# Patient Record
Sex: Male | Born: 1935 | Race: White | Hispanic: No
Health system: Southern US, Community
[De-identification: ages and names within clinical notes are randomized; demographics above are authoritative.]

---

## 2008-10-27 ENCOUNTER — Ambulatory Visit: Payer: Self-pay

## 2009-12-10 ENCOUNTER — Ambulatory Visit: Payer: Self-pay

## 2010-06-09 ENCOUNTER — Ambulatory Visit: Payer: Self-pay

## 2010-08-11 ENCOUNTER — Ambulatory Visit: Payer: Self-pay

## 2011-10-13 ENCOUNTER — Ambulatory Visit: Payer: Self-pay | Admitting: Hospitalist

## 2011-10-13 LAB — PROTIME-INR: INR: 2.8

## 2013-10-25 ENCOUNTER — Ambulatory Visit: Payer: Self-pay

## 2013-10-25 LAB — PROTIME-INR
INR: 2
PROTHROMBIN TIME: 22.3 s — AB (ref 11.5–14.7)

## 2014-08-15 ENCOUNTER — Ambulatory Visit: Payer: Self-pay | Admitting: Internal Medicine

## 2014-08-16 ENCOUNTER — Ambulatory Visit: Payer: Self-pay | Admitting: Internal Medicine

## 2014-08-16 LAB — URINALYSIS, COMPLETE
BILIRUBIN, UR: NEGATIVE
Bacteria: NONE SEEN
Blood: NEGATIVE
Glucose,UR: NEGATIVE mg/dL (ref 0–75)
Hyaline Cast: 2
Ketone: NEGATIVE
LEUKOCYTE ESTERASE: NEGATIVE
Nitrite: NEGATIVE
PH: 5 (ref 4.5–8.0)
Protein: 100
RBC,UR: 3 /HPF (ref 0–5)
Specific Gravity: 1.02 (ref 1.003–1.030)
Squamous Epithelial: NONE SEEN
WBC UR: 1 /HPF (ref 0–5)

## 2014-08-16 LAB — CBC
HCT: 31.5 % — ABNORMAL LOW (ref 40.0–52.0)
HGB: 10.5 g/dL — AB (ref 13.0–18.0)
MCH: 32.7 pg (ref 26.0–34.0)
MCHC: 33.3 g/dL (ref 32.0–36.0)
MCV: 98 fL (ref 80–100)
PLATELETS: 257 10*3/uL (ref 150–440)
RBC: 3.21 10*6/uL — ABNORMAL LOW (ref 4.40–5.90)
RDW: 12.3 % (ref 11.5–14.5)
WBC: 10.1 10*3/uL (ref 3.8–10.6)

## 2014-08-16 LAB — COMPREHENSIVE METABOLIC PANEL
ALBUMIN: 2.6 g/dL — AB (ref 3.4–5.0)
ANION GAP: 4 — AB (ref 7–16)
AST: 58 U/L — AB (ref 15–37)
Alkaline Phosphatase: 81 U/L
BILIRUBIN TOTAL: 0.3 mg/dL (ref 0.2–1.0)
BUN: 37 mg/dL — AB (ref 7–18)
CREATININE: 0.98 mg/dL (ref 0.60–1.30)
Calcium, Total: 8.6 mg/dL (ref 8.5–10.1)
Chloride: 107 mmol/L (ref 98–107)
Co2: 34 mmol/L — ABNORMAL HIGH (ref 21–32)
EGFR (Non-African Amer.): >60
Glucose: 117 mg/dL — ABNORMAL HIGH (ref 65–99)
Osmolality: 298 (ref 275–301)
Potassium: 4.6 mmol/L (ref 3.5–5.1)
SGPT (ALT): 70 U/L — ABNORMAL HIGH
SODIUM: 145 mmol/L (ref 136–145)
Total Protein: 7 g/dL (ref 6.4–8.2)

## 2014-08-16 LAB — FERRITIN: Ferritin (ARMC): 379 ng/mL (ref 8–388)

## 2014-08-16 LAB — TROPONIN I: Troponin-I: 0.02 ng/mL

## 2014-08-16 LAB — CK-MB: CK-MB: 2 ng/mL (ref 0.5–3.6)

## 2014-08-16 LAB — CK TOTAL AND CKMB (NOT AT ARMC)
CK, Total: 198 U/L
CK-MB: 2.5 ng/mL

## 2014-08-16 LAB — IRON AND TIBC
Iron Bind.Cap.(Total): 167 ug/dL — ABNORMAL LOW
Iron Saturation: 23 %
Iron: 39 ug/dL — ABNORMAL LOW
Unbound Iron-Bind.Cap.: 128 ug/dL

## 2014-08-16 LAB — CK: CK, Total: 173 U/L (ref 39–308)

## 2014-08-16 LAB — PROTIME-INR
INR: 1.1
Prothrombin Time: 14.1 secs (ref 11.5–14.7)

## 2014-08-16 LAB — PRO B NATRIURETIC PEPTIDE: B-Type Natriuretic Peptide: 8551 pg/mL — ABNORMAL HIGH (ref 0–450)

## 2014-08-16 LAB — APTT: Activated PTT: 31.3 s

## 2014-08-17 LAB — CBC WITH DIFFERENTIAL/PLATELET
Basophil #: 0 10*3/uL (ref 0.0–0.1)
Basophil %: 0.3 %
EOS PCT: 0.2 %
Eosinophil #: 0 10*3/uL (ref 0.0–0.7)
HCT: 30.1 % — AB (ref 40.0–52.0)
HGB: 9.9 g/dL — ABNORMAL LOW (ref 13.0–18.0)
LYMPHS PCT: 10 %
Lymphocyte #: 1.1 10*3/uL (ref 1.0–3.6)
MCH: 31.9 pg (ref 26.0–34.0)
MCHC: 33 g/dL (ref 32.0–36.0)
MCV: 97 fL (ref 80–100)
Monocyte #: 1.4 x10 3/mm — ABNORMAL HIGH (ref 0.2–1.0)
Monocyte %: 12.2 %
NEUTROS PCT: 77.3 %
Neutrophil #: 8.7 10*3/uL — ABNORMAL HIGH (ref 1.4–6.5)
Platelet: 253 10*3/uL (ref 150–440)
RBC: 3.12 10*6/uL — AB (ref 4.40–5.90)
RDW: 12.3 % (ref 11.5–14.5)
WBC: 11.2 10*3/uL — AB (ref 3.8–10.6)

## 2014-08-17 LAB — TROPONIN I: Troponin-I: <0.02 ng/mL

## 2014-08-17 LAB — BASIC METABOLIC PANEL
Anion Gap: 7 (ref 7–16)
BUN: 29 mg/dL — ABNORMAL HIGH (ref 7–18)
CREATININE: 0.99 mg/dL (ref 0.60–1.30)
Calcium, Total: 8.3 mg/dL — ABNORMAL LOW (ref 8.5–10.1)
Chloride: 103 mmol/L (ref 98–107)
Co2: 34 mmol/L — ABNORMAL HIGH (ref 21–32)
EGFR (African American): >60
EGFR (Non-African Amer.): >60
Glucose: 97 mg/dL (ref 65–99)
Osmolality: 293 (ref 275–301)
POTASSIUM: 3.6 mmol/L (ref 3.5–5.1)
Sodium: 144 mmol/L (ref 136–145)

## 2014-08-17 LAB — CK
CK, TOTAL: 155 U/L (ref 39–308)
CK, Total: 158 U/L (ref 39–308)

## 2014-08-17 LAB — CK-MB: CK-MB: 2 ng/mL (ref 0.5–3.6)

## 2014-08-18 ENCOUNTER — Inpatient Hospital Stay: Payer: Self-pay | Admitting: Internal Medicine

## 2014-08-18 LAB — BASIC METABOLIC PANEL
Anion Gap: 6 — ABNORMAL LOW (ref 7–16)
BUN: 27 mg/dL — AB (ref 7–18)
CHLORIDE: 98 mmol/L (ref 98–107)
CREATININE: 0.92 mg/dL (ref 0.60–1.30)
Calcium, Total: 8.7 mg/dL (ref 8.5–10.1)
Co2: 37 mmol/L — ABNORMAL HIGH (ref 21–32)
GLUCOSE: 95 mg/dL (ref 65–99)
Osmolality: 286 (ref 275–301)
POTASSIUM: 3.6 mmol/L (ref 3.5–5.1)
SODIUM: 141 mmol/L (ref 136–145)

## 2014-08-19 LAB — BASIC METABOLIC PANEL
Anion Gap: 3 — ABNORMAL LOW (ref 7–16)
BUN: 30 mg/dL — ABNORMAL HIGH (ref 7–18)
CALCIUM: 8.4 mg/dL — AB (ref 8.5–10.1)
CHLORIDE: 95 mmol/L — AB (ref 98–107)
Co2: 40 mmol/L (ref 21–32)
Creatinine: 1.24 mg/dL (ref 0.60–1.30)
EGFR (African American): >60
GFR CALC NON AF AMER: 58 — AB
GLUCOSE: 103 mg/dL — AB (ref 65–99)
OSMOLALITY: 282 (ref 275–301)
Potassium: 3.3 mmol/L — ABNORMAL LOW (ref 3.5–5.1)
Sodium: 138 mmol/L (ref 136–145)

## 2014-08-19 LAB — CBC WITH DIFFERENTIAL/PLATELET
COMMENT - H1-COM1: NORMAL
EOS PCT: 3 %
HCT: 33.2 % — ABNORMAL LOW (ref 40.0–52.0)
HGB: 11.3 g/dL — AB (ref 13.0–18.0)
Lymphocytes: 13 %
MCH: 32.9 pg (ref 26.0–34.0)
MCHC: 34.2 g/dL (ref 32.0–36.0)
MCV: 96 fL (ref 80–100)
METAMYELOCYTE: 2 %
Monocytes: 10 %
Myelocyte: 1 %
Platelet: 322 10*3/uL (ref 150–440)
RBC: 3.44 10*6/uL — ABNORMAL LOW (ref 4.40–5.90)
RDW: 12.1 % (ref 11.5–14.5)
Segmented Neutrophils: 71 %
WBC: 12.6 10*3/uL — ABNORMAL HIGH (ref 3.8–10.6)

## 2014-08-19 LAB — CLOSTRIDIUM DIFFICILE(ARMC)

## 2014-08-20 LAB — BASIC METABOLIC PANEL
Anion Gap: 2 — ABNORMAL LOW (ref 7–16)
BUN: 43 mg/dL — AB (ref 7–18)
CALCIUM: 8.4 mg/dL — AB (ref 8.5–10.1)
CHLORIDE: 97 mmol/L — AB (ref 98–107)
CO2: 38 mmol/L — AB (ref 21–32)
Creatinine: 1.24 mg/dL (ref 0.60–1.30)
EGFR (African American): >60
EGFR (Non-African Amer.): 58 — ABNORMAL LOW
Glucose: 110 mg/dL — ABNORMAL HIGH (ref 65–99)
OSMOLALITY: 285 (ref 275–301)
POTASSIUM: 3.9 mmol/L (ref 3.5–5.1)
Sodium: 137 mmol/L (ref 136–145)

## 2014-08-21 LAB — CULTURE, BLOOD (SINGLE)

## 2014-08-23 LAB — BASIC METABOLIC PANEL
Anion Gap: 6 — ABNORMAL LOW (ref 7–16)
BUN: 44 mg/dL — ABNORMAL HIGH (ref 7–18)
CALCIUM: 8.7 mg/dL (ref 8.5–10.1)
Chloride: 103 mmol/L (ref 98–107)
Co2: 32 mmol/L (ref 21–32)
Creatinine: 1.08 mg/dL (ref 0.60–1.30)
GLUCOSE: 120 mg/dL — AB (ref 65–99)
Osmolality: 294 (ref 275–301)
POTASSIUM: 4.9 mmol/L (ref 3.5–5.1)
SODIUM: 141 mmol/L (ref 136–145)

## 2014-08-24 LAB — CBC WITH DIFFERENTIAL/PLATELET
Basophil #: 0 10*3/uL (ref 0.0–0.1)
Basophil %: 0.2 %
EOS ABS: 0 10*3/uL (ref 0.0–0.7)
EOS PCT: 0.1 %
HCT: 31.7 % — ABNORMAL LOW (ref 40.0–52.0)
HGB: 10.9 g/dL — ABNORMAL LOW (ref 13.0–18.0)
Lymphocyte #: 1.5 10*3/uL (ref 1.0–3.6)
Lymphocyte %: 11.7 %
MCH: 36.5 pg — AB (ref 26.0–34.0)
MCHC: 34.2 g/dL (ref 32.0–36.0)
MCV: 107 fL — ABNORMAL HIGH (ref 80–100)
MONO ABS: 1.2 x10 3/mm — AB (ref 0.2–1.0)
Monocyte %: 9 %
NEUTROS PCT: 79 %
Neutrophil #: 10.1 10*3/uL — ABNORMAL HIGH (ref 1.4–6.5)
PLATELETS: 359 10*3/uL (ref 150–440)
RBC: 2.98 10*6/uL — AB (ref 4.40–5.90)
RDW: 12.5 % (ref 11.5–14.5)
WBC: 12.7 10*3/uL — ABNORMAL HIGH (ref 3.8–10.6)

## 2014-08-24 LAB — COMPREHENSIVE METABOLIC PANEL
ALBUMIN: 2.2 g/dL — AB (ref 3.4–5.0)
ANION GAP: 3 — AB (ref 7–16)
AST: 23 U/L (ref 15–37)
Alkaline Phosphatase: 61 U/L
BUN: 41 mg/dL — ABNORMAL HIGH (ref 7–18)
Bilirubin,Total: 0.3 mg/dL (ref 0.2–1.0)
CO2: 34 mmol/L — AB (ref 21–32)
Calcium, Total: 8.5 mg/dL (ref 8.5–10.1)
Chloride: 102 mmol/L (ref 98–107)
Creatinine: 1.05 mg/dL (ref 0.60–1.30)
EGFR (African American): >60
GLUCOSE: 97 mg/dL (ref 65–99)
Osmolality: 288 (ref 275–301)
Potassium: 4.5 mmol/L (ref 3.5–5.1)
SGPT (ALT): 33 U/L
Sodium: 139 mmol/L (ref 136–145)
Total Protein: 6 g/dL — ABNORMAL LOW (ref 6.4–8.2)

## 2014-08-25 LAB — WBC: WBC: 14 10*3/uL — AB (ref 3.8–10.6)

## 2014-08-26 LAB — BASIC METABOLIC PANEL
Anion Gap: 1 — ABNORMAL LOW (ref 7–16)
BUN: 46 mg/dL — ABNORMAL HIGH (ref 7–18)
CALCIUM: 8.3 mg/dL — AB (ref 8.5–10.1)
CHLORIDE: 102 mmol/L (ref 98–107)
CO2: 36 mmol/L — AB (ref 21–32)
Creatinine: 1.05 mg/dL (ref 0.60–1.30)
EGFR (African American): >60
EGFR (Non-African Amer.): >60
Glucose: 92 mg/dL (ref 65–99)
OSMOLALITY: 289 (ref 275–301)
POTASSIUM: 4.7 mmol/L (ref 3.5–5.1)
Sodium: 139 mmol/L (ref 136–145)

## 2014-08-26 LAB — WBC: WBC: 15.1 10*3/uL — ABNORMAL HIGH (ref 3.8–10.6)

## 2014-09-15 ENCOUNTER — Ambulatory Visit: Payer: Self-pay | Admitting: Internal Medicine

## 2014-12-10 NOTE — H&P (Signed)
PATIENT NAME:  Ralph Carrillo, Ralph Carrillo MR#:  [id number] DATE OF BIRTH:  01/20/1919  DATE OF ADMISSION:  08/16/2014  REFERRING EMERGENCY ROOM PHYSICIAN: Cecille Amsterdam. Mayford Knife, MD  PRIMARY CARE PHYSICIAN: Nonlocal. The patient is from [place], [place].   HISTORY OF PRESENT ILLNESS: The history of present illness is provided by his daughter, as the patient is feeling ill. The daughter reports that on December 24, patient started to feel poorly, with shortness of breath and coughing. She reports that he has been unable to sleep in his bed with fewer than 2 pillows and has usually been sleeping in a chair. He has been more confused than normal. He has had a temperature up to 99 degrees at home. No chest pain. His appetite has been decreased. No vomiting, diarrhea, or complaints of abdominal pain. He went to the [facility] today and was found to be acutely dyspneic with good oxygen saturation and was referred to the Emergency Room. On presentation to the Emergency Room, his oxygen saturations were in the high 80s which improved on nasal cannula.   PAST MEDICAL HISTORY: 1. Osteoarthritis in the right knee.  2. Mild dementia.  3. The patient has had a defibrillator in place since the 1980s. His daughter is not sure what diagnosis caused him to have the defibrillator placed. She states that this is not a pacemaker.  4. Daughter reports occasional need for supplemental oxygen. She does not know what diagnosis causes this to occur. He does not have a known diagnosis of COPD or congestive heart failure.   SOCIAL HISTORY: The patient lives in [place]. His primary care and cardiologist are both in [place]. He quit smoking in the 1960s. Does not drink alcohol. He uses a walker for ambulation.   FAMILY MEDICAL HISTORY: Positive for congenital arrhythmia, which has caused many of his young family members to have defibrillators placed. No history of stroke. No history of coronary artery disease known to the daughter.    ALLERGIES: THE PATIENT IS ALLERGIC TO STRAWBERRIES AND TO LIPITOR.   HOME MEDICATIONS: 1. Zyflamend tablets 1 tablet twice a day.  2. Vitamin D3 at 5000 international units 2 tablets once a day.  3. Vitamin B12 at 1 tablet once a day.  4. Pro-Omega tablet 1 tablet twice a day.  5. Levothyroxine 50 mcg 1 tablet once a day.  6. Glucosamine 750 mg 1 tablet twice a day.  7. Choleast 1 tablet once a day in the evening.  8. Aspirin 81 mg 1 tablet once a day.  9. Advil 200 mg 1 tablet every 6 hours as needed for pain.   REVIEW OF SYSTEMS: The patient is unable to perform the review of systems due to dementia and fatigue.   PHYSICAL EXAMINATION: VITAL SIGNS: Temperature 98.4, pulse 71, respirations 22, blood pressure 160/95, oxygenation 99% on 2 liters nasal cannula.  GENERAL: The patient appears fatigued, in no acute distress.  HEENT: Pupils equal, round, and reactive to light. Conjunctivae are clear. Extraocular motion intact. Oral mucous membranes are dry. Good dentition. Posterior oropharynx is clear with no exudate, edema, or erythema.  NECK: Supple, trachea midline. No cervical lymphadenopathy.  RESPIRATORY: There are bibasilar crackles. No wheezes or rhonchi. Good air movement.  CARDIOVASCULAR: Tachycardic, regular. No murmurs, rubs, or gallops. No peripheral edema. Peripheral pulses are 2+.  ABDOMEN: Soft, nontender, nondistended. Bowel sounds are normal. No guarding, no rebound, no hepatosplenomegaly, no mass.  MUSCULOSKELETAL: No joint effusions. Range of motion is normal. Strength is decreased  generally but is symmetric.  NEUROLOGIC: Cranial nerves II through XII grossly intact. Strength is generally decreased due to fatigue and illness. No focal defects. Sensation is intact.  PSYCHIATRIC: The patient is lethargic but he does open his eyes and answer appropriately. He is oriented to person, place, and situation. No signs of uncontrolled depression or anxiety.   LABORATORY DATA:  Sodium 145, potassium 4.6, chloride 107, bicarbonate 34, BUN 37, creatinine 0.98, glucose 117. BNP is 8551. LFTs are normal with the exception of a decreased albumin at 2.6. AST is 58, ALT is 70, both elevated. Initial set of cardiac enzymes was negative with a troponin of less than 0.02. White blood cells 10.1, hemoglobin 10.5, platelets 257,000, MCV 98. INR is 1.1. Urinalysis negative for signs of infection.   IMAGING:  1. CT of the head without contrast shows no acute intracranial abnormality. There is diffuse atrophy and old lacunar infarct in the caudate nucleus.  2. Chest x-ray shows mild cardiac enlargement with small left pleural effusion. Mild chronic-appearing bronchitic changes. Probable scarring peripherally in the right middle lobe. Compression deformity, age uncertain.   ASSESSMENT AND PLAN: 1. Acute respiratory failure with hypoxia: The patient presented with oxygen saturation of 87% on room air which improved to the 90s with 2 liters of nasal cannula. Etiology of this is unclear. Question if this could be a congestive heart failure exacerbation. Though the patient does not seem to have a prior history of congestive heart failure, he does have a very elevated BNP and he does have lower extremity edema. Will check a 2-D echocardiogram. Will monitor on telemetry. Check daily weights, I's and O's and maintain on a low-sodium diet. He has received 20 mg of Lasix IV in the Emergency Room and may need more if respiratory status is not improved. He does not have a leukocytosis or fever to suggest pneumonia. X-ray does not show pneumonia.  2. Altered mental status: This may be progression of dementia. Urinalysis negative for infection. Chest x-ray negative for pneumonia: CT of the head is negative. Hopefully, improving his respiratory status will improve his mental status as well. Continue to monitor carefully.  3. Generalized weakness: Will order a physical therapy evaluation.  4. History of  arrhythmia: The patient has had a defibrillator in place since the 1980s. EKG here is normal sinus. Will observe on telemetry.  5. Anemia: His hemoglobin is 10.5. Will order iron studies.  6. Prophylaxis: Heparin for deep vein thrombosis prophylaxis. No gastrointestinal prophylaxis at this time.   TIME SPENT ON ADMISSION: 45 minutes.     ____________________________ Ena Dawleyatherine P. Clent RidgesWalsh, MD cpw:lm D: 08/16/2014 19:41:56 ET T: 08/16/2014 20:51:40 ET JOB#: [id number]  cc: Santina Evansatherine P. Clent RidgesWalsh, MD, <Dictator> Gale JourneyATHERINE P WALSH MD ELECTRONICALLY SIGNED 08/17/2014 20:12

## 2014-12-14 NOTE — Discharge Summary (Signed)
PATIENT NAME:  Ralph Carrillo, Ralph Carrillo MR#:  [id number] DATE OF BIRTH:  1919-04-28  DATE OF ADMISSION:  08/18/2014 DATE OF DISCHARGE:  08/25/2014  Please refer to the interim discharge summary dictated by Dr. Milagros LollSrikar Sudini on 08/22/2014.   DISCHARGE DIAGNOSES:  1.  Acute on chronic respiratory failure with hypoxia.  2.  Acute on chronic diastolic congestive heart failure.  3.  Acute pneumonia, due to unknown infectious agent; also possible aspiration pneumonia.  4.  Malignant essential hypertension.  5.  Dementia. 6.  Generalized weakness.  7.  History of sick sinus syndrome, status post automatic implantable cardioverter-defibrillator as well as permanent pacemaker placement.  8.  History of right knee osteoarthritis.  9.  Recent echocardiogram revealing moderate mitral regurgitation, moderate to severe aortic stenosis, moderate tricuspid regurgitation, moderate pulmonary hypertension.  10.  Anemia of chronic disease.   DISCHARGE CONDITION: Stable.   DISCHARGE MEDICATIONS:   1.   Glucosamine 750 mg p.o. twice daily.  2.  Zyflamend tablet, 1 tablet twice daily.  3.  Levothyroxine 50 mcg p.o. daily.  4.  Vitamin D3 5000 units 2 tablets once daily.  5.  Vitamin B12 1 tablet once daily at lunch.  6.  Aspirin 81 mg p.o. daily.  7.  Pro-Omega tablets 1 tablet twice daily with breakfast and in the evening,  8.  Choleast 1 tablet once in the evening.  9.  Advil 200 mg p.o. every 6 hours as needed.  10.  Metoprolol tartrate 50 mg p.o. twice daily.  11.  Furosemide 20 mg p.o. daily.  12.  Klor-Con 10 mEq once daily.  13.  Prednisone taper 40 mg p.o. once, then taper x 10 mg every 2 days until stopped.  14.  Albuterol nebulizer 2.5 mg in 3 mL inhalation solution every 4 hours as needed.  15.  Budesonide formoterol 160/4.5, 2 puffs twice daily.  16.  Tiotropium 1 inhalation once daily.  17.  Levofloxacin 750 mg p.o. every 48 hours for 5 more days.   HOME OXYGEN: Portable tank at 4 L of oxygen  through nasal cannula, weaning him down to room air as tolerated.   DIET: Two-gram-salt, low-fat, low-cholesterol, mechanical soft diet.   ACTIVITY LIMITATIONS: As tolerated.   REFERRALS: To physical therapy, speech therapy, as well as occupational therapy.  FOLLOWUP APPOINTMENTS: With cardiologist and primary care physician 1-2 days after discharge.   HOSPITAL COURSE: The patient is a 81 year old male with a past medical history significant for a history of osteoarthritis, history of chronic respiratory failure, who requires oxygen therapy intermittently. He presented to the hospital with complaints of shortness of breath as well as coughing. Please refer to Dr. Jerrye Nobleatherine Walsh's admitting note on 08/21/2014, as well as the interim discharge summary dictated by Dr. Elpidio AnisSudini on 08/23/2014.  1.  In regard to acute on chronic respiratory failure, the patient's reason for chronic respiratory failure was felt to be due to a combination of pneumonia as well as congestive heart failure. The patient initially required high flow nasal cannula oxygen; however, it improved slowly, and now he is on 3-4 L of oxygen per nasal cannula and his oxygenation is satisfactory. The patient is to continue steroid taper, tiotropium, Symbicort as well as DuoNebs and oxygen therapy as needed to keep his pulse oximetry at around 92 and above. On the day of discharge, 08/25/2014, the patient's temperature was 96.5, pulse was 65, respirations 18, blood pressure 132/77, saturation was 96% on 4 L of oxygen through nasal cannula at  rest.  2.  In regard to pneumonia, due to unclear infectious agent, questionable aspiration. The patient was continued on antibiotic therapy. He underwent speech therapy evaluation and diet was modified. The patient is to continue antibiotic therapy for 5 more days. Extended antibiotic therapy was recommended at this time. The patient is to continue scheduled nebulizers. He underwent CT scanning of his chest,  done on 08/23/2014, which was negative for pulmonary embolism or thoracic dissection. Extensive left lower lobe atelectasis with debris in the left lower lobe central airways was also found, moderate bilateral pleural effusions, and nonspecific bilateral small pulmonary nodules up to 6 mm were also noted. According to radiologist's recommendations, if the patient was at high risk for bronchogenic carcinoma, followup chest CT at 6-12 months was recommended, and if the patient is low-risk for bronchogenic carcinoma, followup chest CT at 12 months is recommended, according to consensus statement guidelines for management of small pulmonary nodules detected on CT scan. Ectatic thoracic aorta at 3.9 cm was noted. Consider annual imaging follow up by CTA or MRA according to recommendations. 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 Because of these findings, we felt that the patient would benefit from extended speech evaluation and followup. We are concerned that the patient may have recurrent aspirations. The patient will be having a modified barium swallowing study according to the speech therapist; although, initially she felt that that was not important since the patient was overall improving. 3.  Malignant essential hypertension.  Apparently, on arrival to the hospital, the patient required quite a significant amount of blood pressure medications, although his blood pressure is much better now controlled with just metoprolol.  4.  Regarding dementia, the patient will likely needed some help with feeds, as well as supportive therapy.  4.  For generalized weakness, the patient was evaluated with physical therapist and skilled nursing facility was recommended. The patient will likely be discharged to [facility], where he lived in the past.  5.  For his chronic medical problems, such as sick sinus  syndrome status post automatic implantable cardioverter-defibrillator and permanent pacemaker placement, no other changes were made.  6.  In regard to acute on chronic congestive heart failure. No signs of fluid overload were noted. The patient is to continue p.o. Lasix, as well as potassium supplementation.   LABORATORY DATA: Most recently done on 08/24/2014, showed elevated BUN to 41; otherwise, creatinine was 1.05, sodium and potassium 139 and 4.5 respectively. The patient's CO2 level was elevated at 34. Liver Enzymes: Albumin level of 2.2, total protein of 6.0; otherwise liver enzymes were normal. White blood cell count was elevated to 12.7, hemoglobin was 10.9, platelet count 359,000 with MCV high at 107. Absolute neutrophil count was elevated at 10.1. Repeat white blood cell count checked on 08/25/2014 showed elevation to 14.0. It is recommended to follow the patient's white blood cell count as an outpatient, especially since he is tapering now steroids.   TIME SPENT: 40 minutes.     ____________________________ Katharina Caper, MD rv:MT D: 08/25/2014 09:35:28 ET T: 08/25/2014 10:08:31 ET JOB#: [id number]  cc: Katharina Caper, MD, <Dictator> Primary Care at [facility] Card[facility] Oluchi Pucci MD ELECTRONICALLY SIGNED 09/04/2014 9:42

## 2014-12-14 NOTE — Discharge Summary (Signed)
PATIENT NAME:  Ralph Carrillo, Ralph Carrillo MR#:  [id number] DATE OF BIRTH:  [birth year]  DATE OF ADMISSION:  08/18/2014 DATE OF DISCHARGE:  08/26/2014  ADDENDUM TO DISCHARGE SUMMARY  Initially we felt that the patient does not need a modified barium swallowing study since he was overall improving; however, due to the patient's family's insistence, the patient still underwent a modified barium swallowing study on 08/25/2014. Modified barium swallowing study was actually unremarkable with no significant penetration or aspiration.   The patient was advised to continue the same diet, as previously mentioned, which is mechanical soft diet, easy to chew, low-fat, low-cholesterol diet, with no additional salt diet, with no straws and thin liquids.   The patient is being discharged to a skilled nursing facility today on 08/26/2014.   VITAL SIGNS: On the day of discharge, temperature was 97.4, pulse was 60, respiration rate was 20, blood pressure 122/77, saturation was 98% on 4 liters of oxygen per nasal cannula at rest.   TIME SPENT: 40 minutes.   ____________________________ Katharina Caperima Kimetha Trulson, MD rv:JT D: 08/26/2014 08:51:00 ET T: 08/26/2014 09:00:57 ET JOB#: [id number]  cc: Katharina Caperima Ashir Kunz, MD, <Dictator> [place] Paiz MD ELECTRONICALLY SIGNED 09/04/2014 9:45

## 2016-08-15 DEATH — deceased

## 2016-08-20 IMAGING — CR DG CHEST 2V
2 series · 2 of 2 positions shown · non-contrast
Comparison: None.

CLINICAL DATA: Initial evaluation for cough

EXAM:
CHEST  2 VIEW

[chest pa]
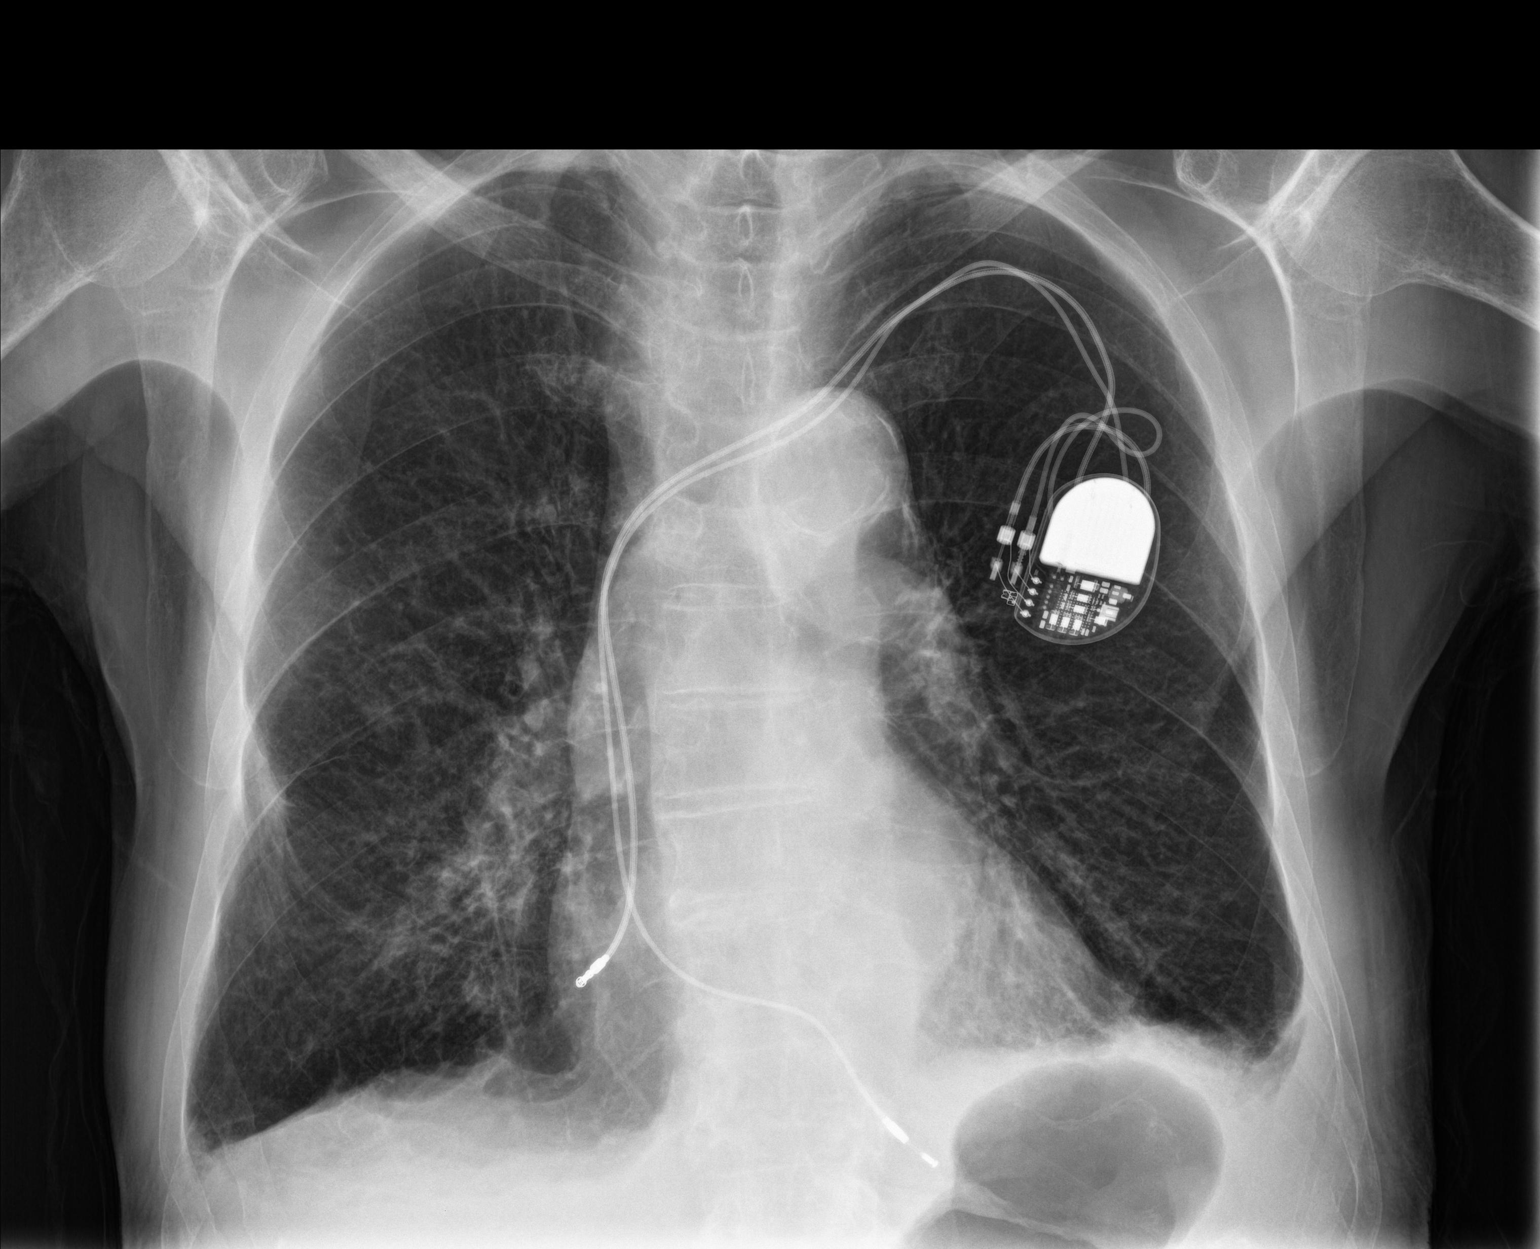

[chest lat]
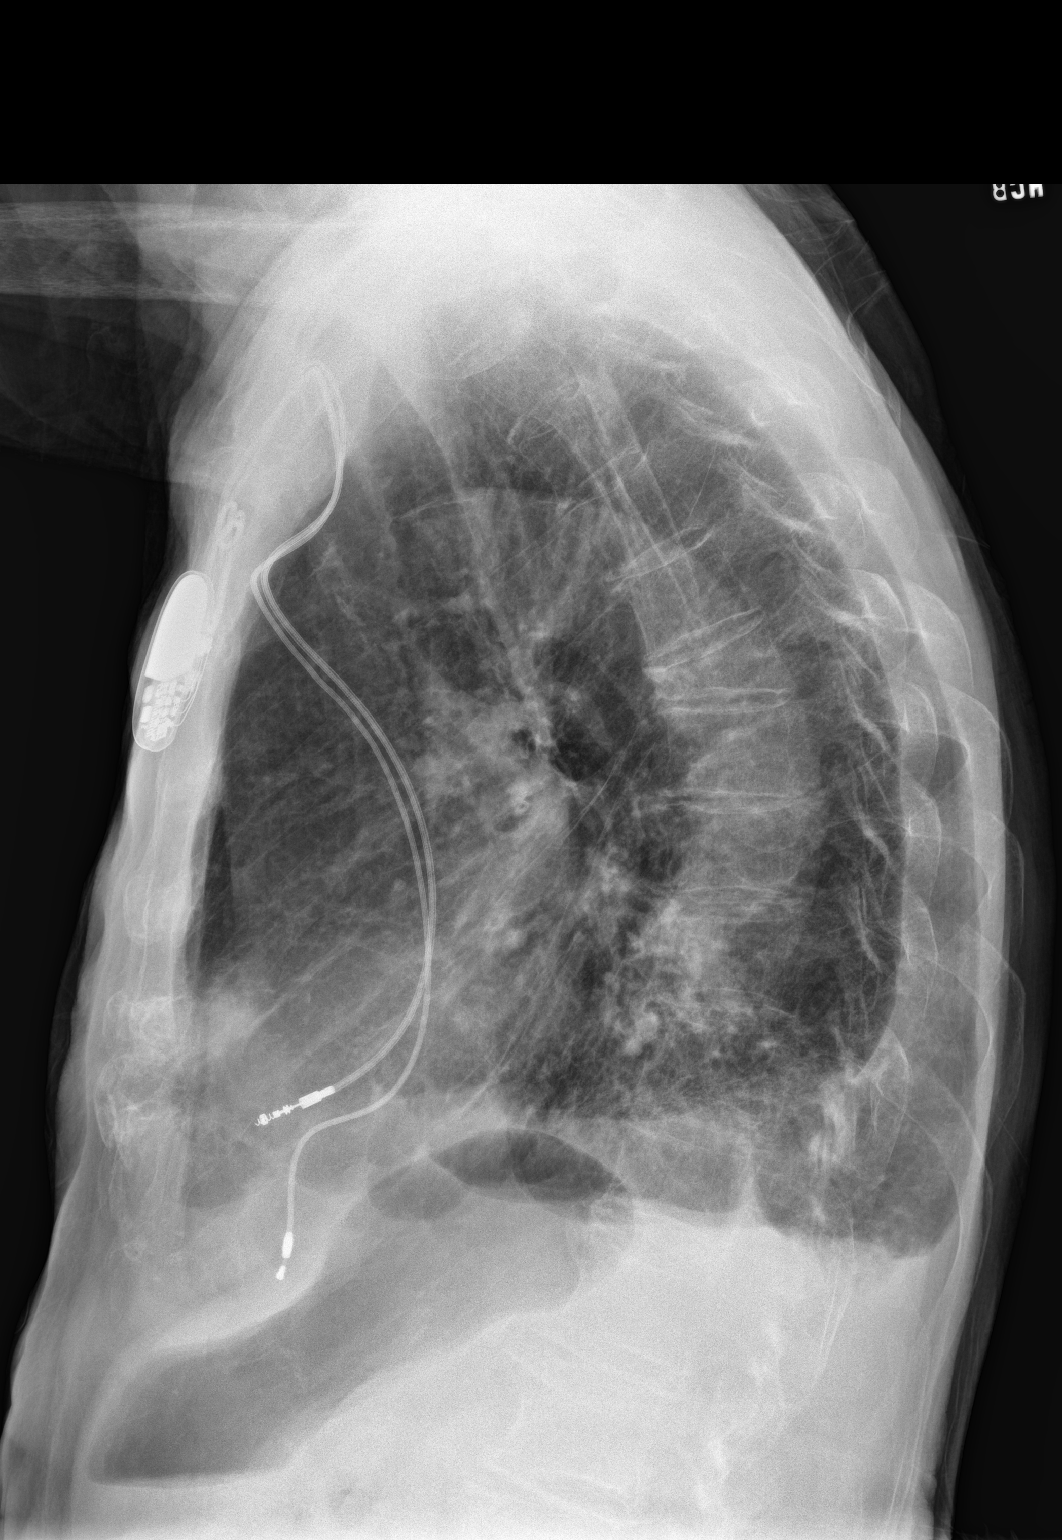

[2 of 2 positions shown; findings below may reference images not displayed]

FINDINGS: Mild cardiac enlargement. Two lead cardiac pacer. Vascular pattern
normal.

Mild perihilar bronchiectasis and peribronchial wall thickening
likely chronic. Small left pleural effusion. Trace right pleural
effusion also suspected.

Pleural-based opacity laterally in the right middle lobe appears
most consistent with a focus of pleural scarring or peripheral
atelectasis. There is aortic calcification and uncoiling. There is a
moderate compression deformity of about 50% involving an upper to
mid thoracic vertebral body. Of uncertain age but without evidence
of retropulsion.
IMPRESSION: Mild cardiac enlargement with small left pleural effusion

Mild chronic appearing bronchitic change

Probable scarring peripherally in the right middle lobe

Compression deformity age uncertain

## 2016-08-27 IMAGING — CR DG CHEST 2V
1 series · 3 of 3 positions shown · non-contrast
Comparison: 08/20/2014; 08/17/2014; 08/16/2014

CLINICAL DATA: Shortness of breath for 1 week. History of CHF and
bronchitis

EXAM:
CHEST  2 VIEW

[Series 1: dxr chest pa (or ap) and lateral · 0.14mm/px · 3 of 3 slices shown]
[im 1/3]
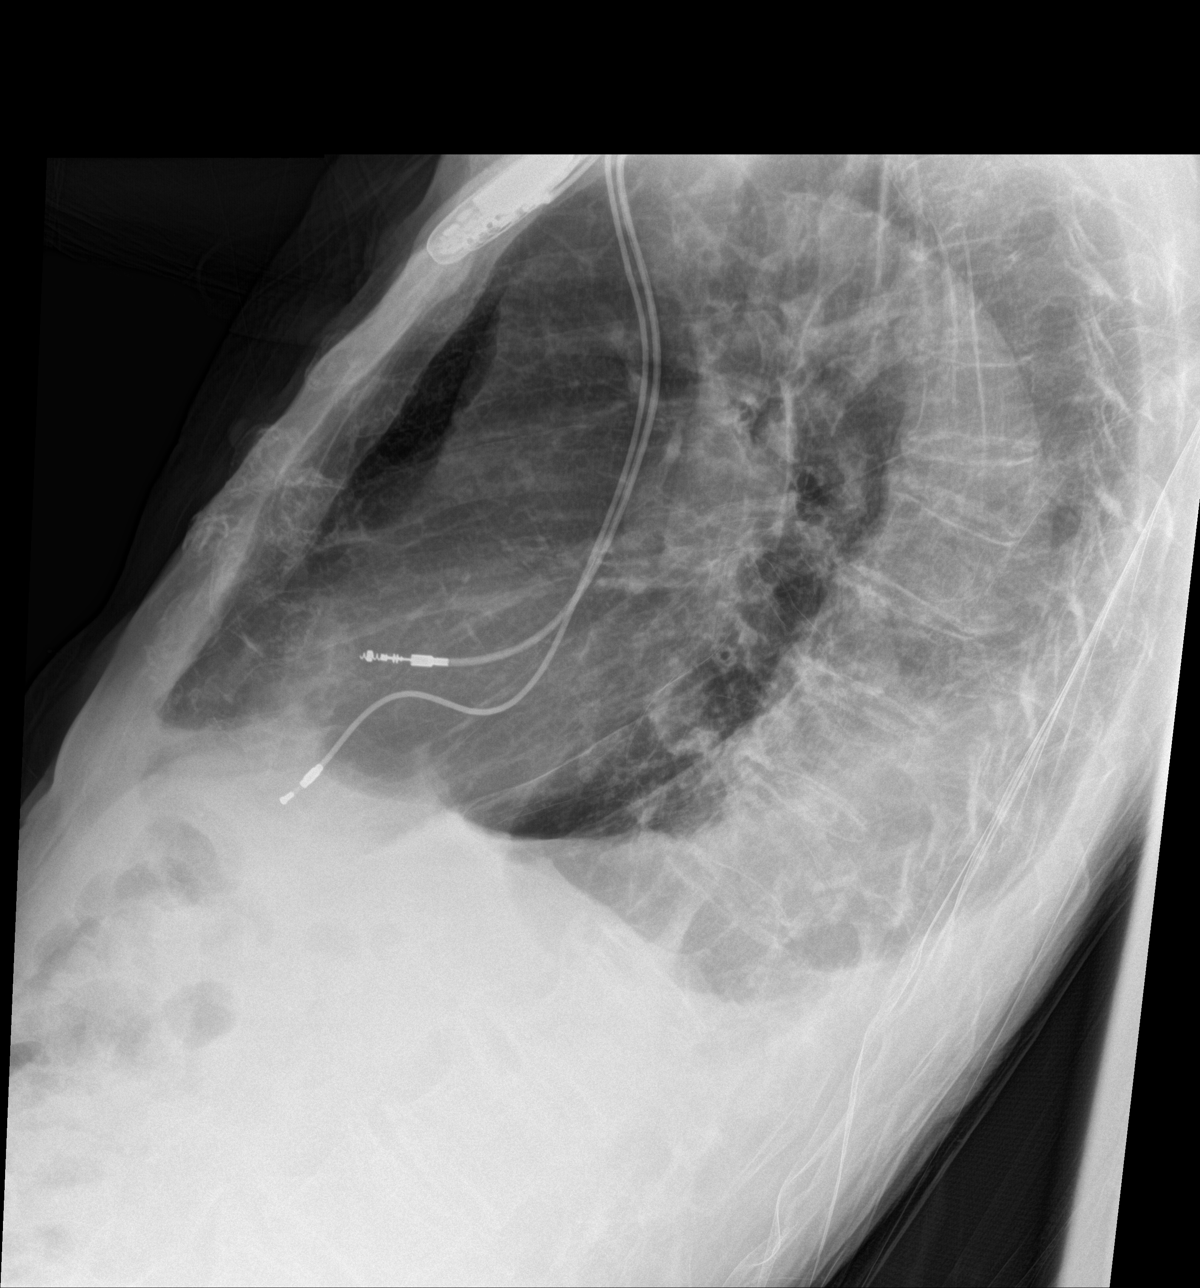
[im 2/3]
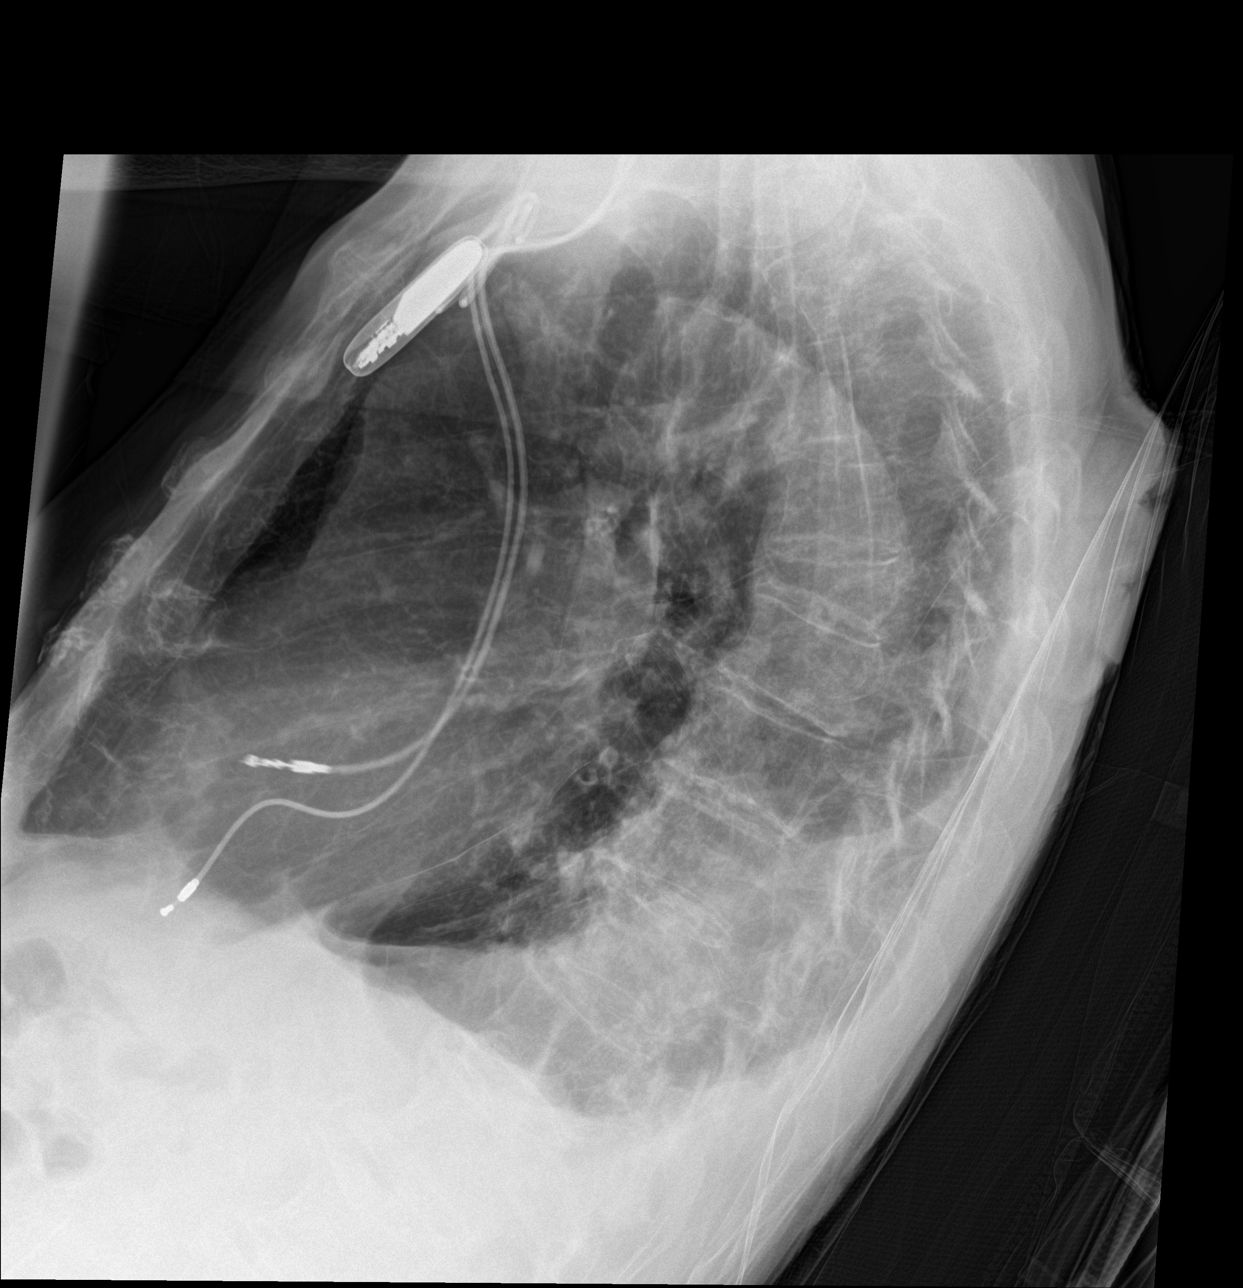
[im 3/3]
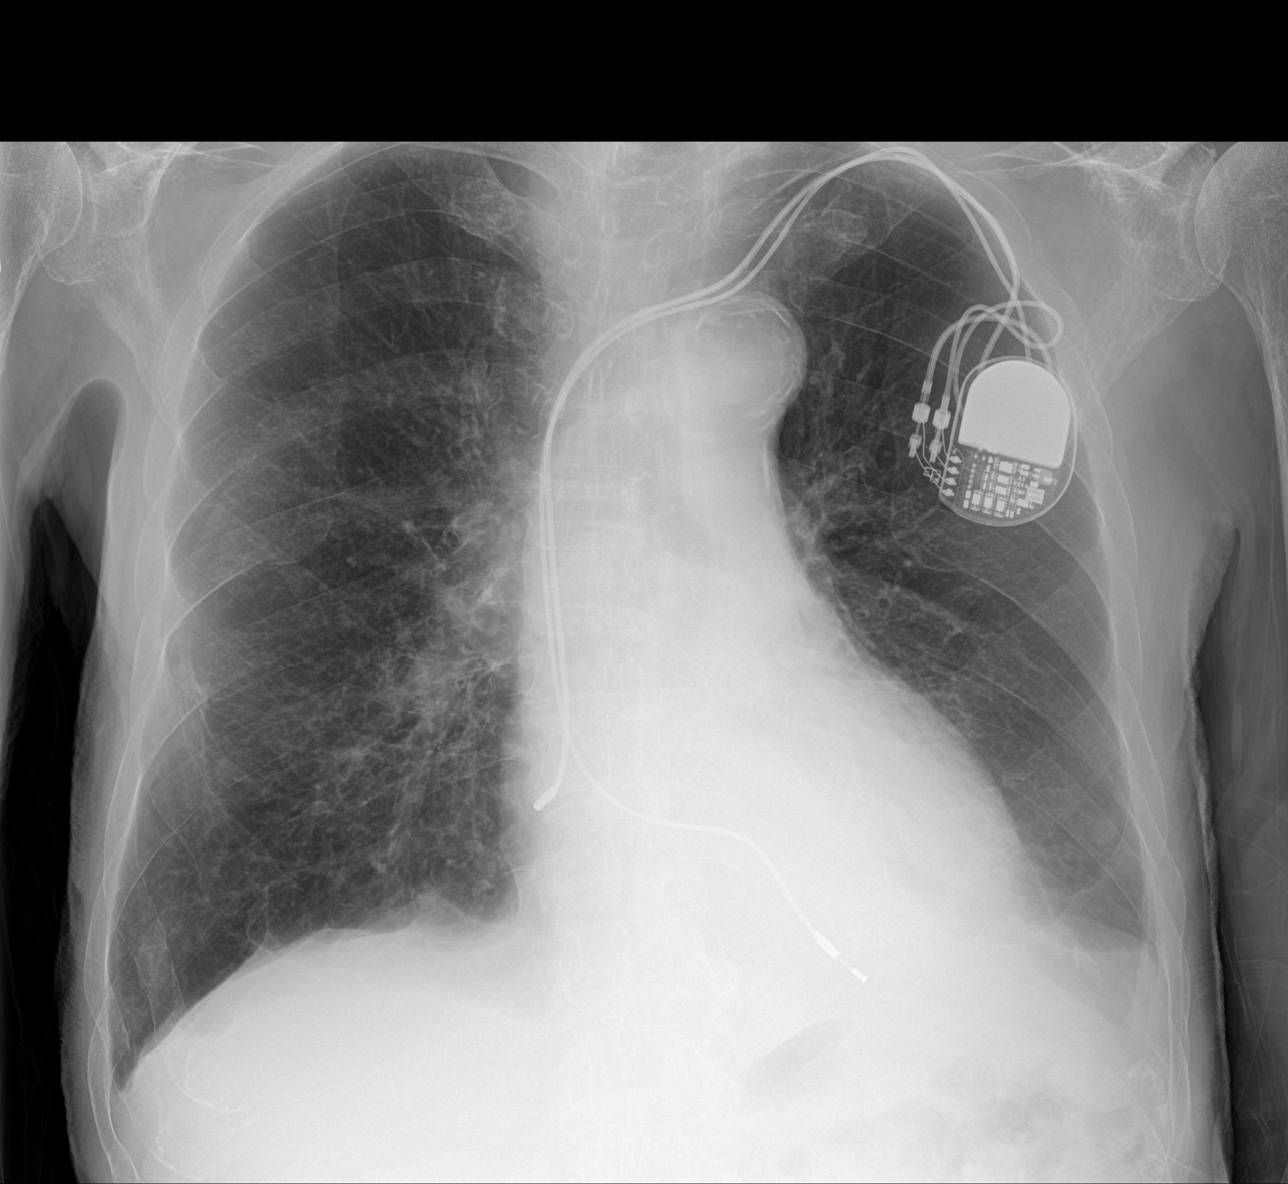

[3 of 3 positions shown; findings below may reference images not displayed]

FINDINGS: Grossly unchanged cardiac silhouette and mediastinal contours.
Stable position of support apparatus. Atherosclerotic plaque within
the thoracic aorta. The lungs remain hyperexpanded with diffuse
nodular thickening of the pulmonary interstitium. Overall improved
aeration of the lungs with persistent ill-defined heterogeneous
opacities within the peripheral aspect of the right mid lung favored
to represent architectural distortion/scar. Nipple shadows overlie
the lower lungs bilaterally. Persistent heterogeneous size
consolidative retrocardiac opacities. There is persistent blunting
of bilateral costophrenic angles, left greater than right,
suggestive of small bilateral effusions. No pneumothorax. No
evidence of edema. Unchanged bones.
IMPRESSION: 1. Continued improved aeration of the lungs with persistent left
basilar heterogeneous / consolidative opacities, possibly
atelectasis though worrisome for residual underlying infection.
2. Similar findings of advanced emphysematous and bronchitic change.
3. Unchanged trace bilateral effusions (left greater than right),
without evidence of edema.
